# Patient Record
Sex: Female | Born: 2000
Health system: Southern US, Community
[De-identification: ages and names within clinical notes are randomized; demographics above are authoritative.]

## PROBLEM LIST (undated history)

## (undated) DIAGNOSIS — J45909 Unspecified asthma, uncomplicated: Secondary | ICD-10-CM

## (undated) DIAGNOSIS — T7840XA Allergy, unspecified, initial encounter: Secondary | ICD-10-CM

## (undated) HISTORY — PX: TYMPANOSTOMY TUBE PLACEMENT: SHX32

## (undated) HISTORY — DX: Allergy, unspecified, initial encounter: T78.40XA

## (undated) HISTORY — DX: Unspecified asthma, uncomplicated: J45.909

---
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
## 2019-02-16 ENCOUNTER — Other Ambulatory Visit: Payer: Self-pay

## 2019-02-16 ENCOUNTER — Ambulatory Visit (INDEPENDENT_AMBULATORY_CARE_PROVIDER_SITE_OTHER): Payer: BC Managed Care – PPO | Admitting: Family Medicine

## 2019-02-16 ENCOUNTER — Encounter: Payer: Self-pay | Admitting: Family Medicine

## 2019-02-16 VITALS — BP 90/62 | HR 79 | Temp 98.3°F | Ht 64.5 in | Wt 172.2 lb

## 2019-02-16 DIAGNOSIS — Z7689 Persons encountering health services in other specified circumstances: Secondary | ICD-10-CM | POA: Diagnosis not present

## 2019-02-16 DIAGNOSIS — J45990 Exercise induced bronchospasm: Secondary | ICD-10-CM | POA: Diagnosis not present

## 2019-02-16 DIAGNOSIS — Z23 Encounter for immunization: Secondary | ICD-10-CM | POA: Diagnosis not present

## 2019-02-16 NOTE — Patient Instructions (Signed)
Good to see you today  If you have any problems with exercise induced asthma, call and let me know, I'll send in an inhaler.   For allergy symptoms, can take over the counter, long acting antihistamine like Zyrtec, Claritin or Allegra (generic is fine).

## 2019-02-16 NOTE — Progress Notes (Signed)
Subjective:    Patient ID: Rachel Ellis, female    DOB: 12/07/2000, 18 y.o.   MRN: [id number]  HPI This is a 18 yo female who presents today to establish care. She is accompanied by her mother. 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 Mixture of in person and online classes. Lives with parents and younger sister. Has a dog.  [facility]   Last CPE- last year Td-11/21/2010 Flu-declines Eye- not for several years Dental-regular Exercise- enjoys dance, plays some tennis Sleep- 7-8 hours per night Stress- occasional, feels that she is coping well with starting college and living from home.   History of exercise induced asthma- required inhaler in the past pre exercise. Denies any chest tightness, cough or wheeze recently with playing tennis. Has seasonal allergies. Per her mother," our family does not like to take medication."   Declines HPV vaccine. Agrees to meningitis B.   History reviewed. No pertinent past medical history. Past Surgical History:  Procedure Laterality Date  . TYMPANOSTOMY TUBE PLACEMENT     as a kid   Social History   Tobacco Use  . Smoking status: Never Smoker  . Smokeless tobacco: Never Used  Substance Use Topics  . Alcohol use: Never    Frequency: Never  . Drug use: Never      Review of Systems Denies headaches, chest pain, chest tightness, wheeze, cough, abdominal pain, diarrhea/constipation, urinary frequency/hematuria/dysuria, heavy periods.     Objective:   Physical Exam Vitals signs reviewed.  Constitutional:      General: She is not in acute distress.    Appearance: Normal appearance. She is normal weight. She is not ill-appearing, toxic-appearing or diaphoretic.  HENT:     Head: Normocephalic and atraumatic.     Right Ear: Tympanic membrane and ear canal normal.     Left Ear: Tympanic membrane normal.  Eyes:     Conjunctiva/sclera: Conjunctivae normal.  Neck:     Musculoskeletal:  Normal range of motion and neck supple. No neck rigidity or muscular tenderness.  Cardiovascular:     Rate and Rhythm: Normal rate and regular rhythm.     Pulses: Normal pulses.     Heart sounds: Normal heart sounds.  Pulmonary:     Effort: Pulmonary effort is normal.     Breath sounds: Normal breath sounds.  Lymphadenopathy:     Cervical: No cervical adenopathy.  Skin:    General: Skin is warm and dry.  Neurological:     Mental Status: She is alert and oriented to person, place, and time.  Psychiatric:        Mood and Affect: Mood normal.        Behavior: Behavior normal.        Thought Content: Thought content normal.        Judgment: Judgment normal.       BP 90/62   Pulse 79   Temp 98.3 F (36.8 C)   Ht 5' 4.5" (1.638 m)   Wt 172 lb 4 oz (78.1 kg)   LMP 01/22/2019   SpO2 98%   BMI 29.11 kg/m      Assessment & Plan:  1. Encounter to establish care - [facility]  2. Need for meningitis vaccination - Meningococcal B, OMV (Bexsero)  3. Exercise-induced asthma - no problems currently, patient was instructed to notify me if she needs pre-exercise inhaler - discussed OTC long acting antihistamine as needed for allergy symptoms  Clarene Reamer, FNP-BC  [facility]  02/17/2019 7:25 AM

## 2019-02-17 ENCOUNTER — Encounter: Payer: Self-pay | Admitting: Family Medicine

## 2020-07-05 DIAGNOSIS — R059 Cough, unspecified: Secondary | ICD-10-CM | POA: Diagnosis not present

## 2020-07-05 DIAGNOSIS — Z87898 Personal history of other specified conditions: Secondary | ICD-10-CM | POA: Diagnosis not present

## 2020-07-05 DIAGNOSIS — R062 Wheezing: Secondary | ICD-10-CM | POA: Diagnosis not present

## 2020-07-05 DIAGNOSIS — J40 Bronchitis, not specified as acute or chronic: Secondary | ICD-10-CM | POA: Diagnosis not present

## 2020-08-24 DIAGNOSIS — R319 Hematuria, unspecified: Secondary | ICD-10-CM | POA: Diagnosis not present

## 2020-08-24 DIAGNOSIS — Z13220 Encounter for screening for lipoid disorders: Secondary | ICD-10-CM | POA: Diagnosis not present

## 2020-08-24 DIAGNOSIS — Z1389 Encounter for screening for other disorder: Secondary | ICD-10-CM | POA: Diagnosis not present

## 2020-08-24 DIAGNOSIS — J4531 Mild persistent asthma with (acute) exacerbation: Secondary | ICD-10-CM | POA: Diagnosis not present

## 2020-08-24 DIAGNOSIS — Z131 Encounter for screening for diabetes mellitus: Secondary | ICD-10-CM | POA: Diagnosis not present

## 2020-08-24 DIAGNOSIS — Z114 Encounter for screening for human immunodeficiency virus [HIV]: Secondary | ICD-10-CM | POA: Diagnosis not present

## 2020-08-24 DIAGNOSIS — Z113 Encounter for screening for infections with a predominantly sexual mode of transmission: Secondary | ICD-10-CM | POA: Diagnosis not present

## 2020-08-24 DIAGNOSIS — Z0000 Encounter for general adult medical examination without abnormal findings: Secondary | ICD-10-CM | POA: Diagnosis not present

## 2020-08-24 DIAGNOSIS — Z1159 Encounter for screening for other viral diseases: Secondary | ICD-10-CM | POA: Diagnosis not present

## 2020-08-24 DIAGNOSIS — J40 Bronchitis, not specified as acute or chronic: Secondary | ICD-10-CM | POA: Diagnosis not present

## 2020-08-29 DIAGNOSIS — Z01818 Encounter for other preprocedural examination: Secondary | ICD-10-CM | POA: Diagnosis not present

## 2020-08-29 DIAGNOSIS — R059 Cough, unspecified: Secondary | ICD-10-CM | POA: Diagnosis not present

## 2020-09-23 DIAGNOSIS — R059 Cough, unspecified: Secondary | ICD-10-CM | POA: Diagnosis not present

## 2020-09-23 DIAGNOSIS — J4531 Mild persistent asthma with (acute) exacerbation: Secondary | ICD-10-CM | POA: Diagnosis not present
